# Patient Record
Sex: Male | Born: 1965 | Race: White | Hispanic: No | Marital: Single | Smoking: Never smoker
Health system: Southern US, Community
[De-identification: ages and names within clinical notes are randomized; demographics above are authoritative.]

## PROBLEM LIST (undated history)

## (undated) DIAGNOSIS — G629 Polyneuropathy, unspecified: Secondary | ICD-10-CM

## (undated) DIAGNOSIS — E119 Type 2 diabetes mellitus without complications: Secondary | ICD-10-CM

---

## 2020-12-05 ENCOUNTER — Other Ambulatory Visit: Payer: Self-pay

## 2020-12-05 ENCOUNTER — Emergency Department (HOSPITAL_COMMUNITY)
Admission: EM | Admit: 2020-12-05 | Discharge: 2020-12-05 | Disposition: A | Discharge status: Home / Self Care | Payer: Medicare (Managed Care) | Attending: Emergency Medicine | Admitting: Emergency Medicine

## 2020-12-05 ENCOUNTER — Emergency Department (HOSPITAL_COMMUNITY): Payer: Medicare (Managed Care)

## 2020-12-05 ENCOUNTER — Encounter (HOSPITAL_COMMUNITY): Payer: Self-pay

## 2020-12-05 DIAGNOSIS — S99921A Unspecified injury of right foot, initial encounter: Secondary | ICD-10-CM | POA: Diagnosis present

## 2020-12-05 DIAGNOSIS — S92424A Nondisplaced fracture of distal phalanx of right great toe, initial encounter for closed fracture: Secondary | ICD-10-CM | POA: Diagnosis not present

## 2020-12-05 DIAGNOSIS — S7001XA Contusion of right hip, initial encounter: Secondary | ICD-10-CM | POA: Insufficient documentation

## 2020-12-05 DIAGNOSIS — S161XXA Strain of muscle, fascia and tendon at neck level, initial encounter: Secondary | ICD-10-CM | POA: Insufficient documentation

## 2020-12-05 DIAGNOSIS — S7000XA Contusion of unspecified hip, initial encounter: Secondary | ICD-10-CM

## 2020-12-05 DIAGNOSIS — E1140 Type 2 diabetes mellitus with diabetic neuropathy, unspecified: Secondary | ICD-10-CM | POA: Diagnosis not present

## 2020-12-05 DIAGNOSIS — W009XXA Unspecified fall due to ice and snow, initial encounter: Secondary | ICD-10-CM | POA: Insufficient documentation

## 2020-12-05 DIAGNOSIS — S7011XA Contusion of right thigh, initial encounter: Secondary | ICD-10-CM | POA: Diagnosis not present

## 2020-12-05 DIAGNOSIS — S7010XA Contusion of unspecified thigh, initial encounter: Secondary | ICD-10-CM

## 2020-12-05 HISTORY — DX: Type 2 diabetes mellitus without complications: E11.9

## 2020-12-05 HISTORY — DX: Polyneuropathy, unspecified: G62.9

## 2020-12-05 MED ORDER — KETOROLAC TROMETHAMINE 60 MG/2ML IM SOLN
60.0000 mg | Freq: Once | INTRAMUSCULAR | Status: AC
  Administered 2020-12-05: 60 mg via INTRAMUSCULAR
  Filled 2020-12-05: qty 2

## 2020-12-05 MED ORDER — METHOCARBAMOL 500 MG PO TABS: 500.0000 mg | ORAL_TABLET | Freq: Three times a day (TID) | ORAL | 0 refills | Status: AC | PRN

## 2020-12-05 NOTE — ED Provider Notes (Signed)
[facility]-EMERGENCY DEPT Provider Note   CSN: [id number] Arrival date & time: 12/05/20  2034     History Chief Complaint  Patient presents with  . Fall    Dylan Vega is a 55 y.o. male.  Patient presents to the emergency department with multiple complaints after a fall that occurred yesterday.  Patient reports that he slipped on ice and fell on steps.  He slid down an entire flight of steps on his right hip.  He did not hit his head or lose consciousness.  Patient complaining of pain in his right great toe, right hip and across his upper back and neck.        Past Medical History:  Diagnosis Date  . Diabetes mellitus without complication (HCC)   . Neuropathy     There are no problems to display for this patient.        No family history on file.     Home Medications Prior to Admission medications   Medication Sig Start Date End Date Taking? Authorizing Provider  methocarbamol (ROBAXIN) 500 MG tablet Take 1 tablet (500 mg total) by mouth every 8 (eight) hours as needed for muscle spasms. 12/05/20  Yes Kordel Leavy, Canary Brim, MD    Allergies    Patient has no allergy information on record.  Review of Systems   Review of Systems  Musculoskeletal: Positive for arthralgias, back pain and neck pain.  Skin: Positive for color change.  Neurological: Positive for headaches.  All other systems reviewed and are negative.   Physical Exam Updated Vital Signs BP 122/79 (BP Location: Left Arm)   Pulse 95   Temp 98.7 F (37.1 C) (Oral)   Resp 16   Ht 6' (1.829 m)   Wt 108.9 kg   SpO2 96%   BMI 32.55 kg/m   Physical Exam Vitals and nursing note reviewed.  Constitutional:      General: He is not in acute distress.    Appearance: Normal appearance. He is well-developed and well-nourished.  HENT:     Head: Normocephalic and atraumatic.     Right Ear: Hearing normal.     Left Ear: Hearing normal.     Nose: Nose normal.     Mouth/Throat:      Mouth: Oropharynx is clear and moist and mucous membranes are normal.  Eyes:     Extraocular Movements: EOM normal.     Conjunctiva/sclera: Conjunctivae normal.     Pupils: Pupils are equal, round, and reactive to light.  Neck:   Cardiovascular:     Rate and Rhythm: Regular rhythm.     Heart sounds: S1 normal and S2 normal. No murmur heard. No friction rub. No gallop.   Pulmonary:     Effort: Pulmonary effort is normal. No respiratory distress.     Breath sounds: Normal breath sounds.  Chest:     Chest wall: No tenderness.  Abdominal:     General: Bowel sounds are normal.     Palpations: Abdomen is soft. There is no hepatosplenomegaly.     Tenderness: There is no abdominal tenderness. There is no guarding or rebound. Negative signs include Murphy's sign and McBurney's sign.     Hernia: No hernia is present.  Musculoskeletal:        General: Normal range of motion.     Cervical back: Normal range of motion and neck supple. No rigidity. Muscular tenderness present. No spinous process tenderness. Normal range of motion.     Thoracic back:  Tenderness present. No bony tenderness.       Back:     Left hip: No deformity or tenderness. Normal range of motion.     Left upper leg: Swelling and tenderness present.     Right foot: Tenderness (R great toe) present. No deformity or laceration.       Legs:  Skin:    General: Skin is warm, dry and intact.     Findings: No rash.     Nails: There is no cyanosis.  Neurological:     Mental Status: He is alert and oriented to person, place, and time.     GCS: GCS eye subscore is 4. GCS verbal subscore is 5. GCS motor subscore is 6.     Cranial Nerves: No cranial nerve deficit.     Sensory: No sensory deficit.     Coordination: Coordination normal.     Deep Tendon Reflexes: Strength normal.  Psychiatric:        Mood and Affect: Mood and affect normal.        Speech: Speech normal.        Behavior: Behavior normal.        Thought  Content: Thought content normal.     ED Results / Procedures / Treatments   Labs (all labs ordered are listed, but only abnormal results are displayed) Labs Reviewed - No data to display  EKG None  Radiology DG Foot Complete Right  Result Date: 12/05/2020 CLINICAL DATA:  Larey Seat down stairs yesterday, right great toe pain EXAM: RIGHT FOOT COMPLETE - 3+ VIEW COMPARISON:  None. FINDINGS: Frontal, oblique, lateral views of the right foot are obtained. There is a small fracture through the medial aspect of the distal tuft of the first distal phalanx, with overlying soft tissue swelling. No other acute bony abnormalities. Mild diffuse interphalangeal joint space narrowing consistent with osteoarthritis. Small inferior calcaneal spur. IMPRESSION: 1. Small fracture medial aspect distal tuft first distal phalanx. 2. Mild osteoarthritis. Electronically Signed   By: Sharlet Salina M.D.   On: 12/05/2020 21:33   DG Hip Unilat  With Pelvis 2-3 Views Right  Result Date: 12/05/2020 CLINICAL DATA:  Larey Seat down 15 stairs yesterday, right hip pain EXAM: DG HIP (WITH OR WITHOUT PELVIS) 2-3V RIGHT COMPARISON:  None. FINDINGS: Frontal view of the pelvis as well as frontal and frogleg lateral views of the right hip are obtained. No acute fracture, subluxation, or dislocation. Mild symmetrical hip osteoarthritis. Sacroiliac joints are normal. IMPRESSION: 1. No acute displaced fracture. 2. Mild symmetrical hip osteoarthritis. Electronically Signed   By: Sharlet Salina M.D.   On: 12/05/2020 21:34    Procedures Procedures   Medications Ordered in ED Medications  ketorolac (TORADOL) injection 60 mg (60 mg Intramuscular Given 12/05/20 2336)    ED Course  I have reviewed the triage vital signs and the nursing notes.  Pertinent labs & imaging results that were available during my care of the patient were reviewed by me and considered in my medical decision making (see chart for details).    MDM Rules/Calculators/A&P                           Patient presents to the emergency department for evaluation after a fall.  Patient with a large bruising hematoma of the lateral aspect of the right thigh but x-ray is normal.  Straight of toe shows nondisplaced tuft fracture, does not require any specific treatment.  He is concerned  about being a diabetic, will refer to podiatry for ongoing foot care.  Patient with right-sided paraspinal neck tenderness and upper back/shoulder tenderness but no significant midline tenderness, does not require imaging.  Cervical spine cleared by Nexus criteria.  Final Clinical Impression(s) / ED Diagnoses Final diagnoses:  Cervical strain, acute, initial encounter  Contusion of hip and thigh, initial encounter  Closed nondisplaced fracture of distal phalanx of right great toe, initial encounter    Rx / DC Orders ED Discharge Orders         Ordered    methocarbamol (ROBAXIN) 500 MG tablet  Every 8 hours PRN        12/05/20 2341           Gilda Crease, MD 12/05/20 2341

## 2020-12-05 NOTE — ED Triage Notes (Signed)
Patient arrived stating yesterday he slid down about 15 stairs on his right hip. Complaints of right great toe pain and some upper back pain. No LOC. Significant right hip bruising. Patient ambulatory in triage.

## 2020-12-08 ENCOUNTER — Other Ambulatory Visit: Payer: Self-pay

## 2020-12-08 ENCOUNTER — Encounter (HOSPITAL_COMMUNITY): Payer: Self-pay

## 2020-12-08 ENCOUNTER — Emergency Department (HOSPITAL_COMMUNITY)
Admission: EM | Admit: 2020-12-08 | Discharge: 2020-12-08 | Disposition: A | Discharge status: Home / Self Care | Payer: Medicare (Managed Care) | Attending: Emergency Medicine | Admitting: Emergency Medicine

## 2020-12-08 ENCOUNTER — Emergency Department (HOSPITAL_COMMUNITY): Payer: Medicare (Managed Care)

## 2020-12-08 DIAGNOSIS — S3992XA Unspecified injury of lower back, initial encounter: Secondary | ICD-10-CM | POA: Insufficient documentation

## 2020-12-08 DIAGNOSIS — W109XXA Fall (on) (from) unspecified stairs and steps, initial encounter: Secondary | ICD-10-CM | POA: Insufficient documentation

## 2020-12-08 DIAGNOSIS — E1140 Type 2 diabetes mellitus with diabetic neuropathy, unspecified: Secondary | ICD-10-CM | POA: Diagnosis not present

## 2020-12-08 DIAGNOSIS — M549 Dorsalgia, unspecified: Secondary | ICD-10-CM

## 2020-12-08 MED ORDER — IBUPROFEN 800 MG PO TABS: 800.0000 mg | ORAL_TABLET | Freq: Three times a day (TID) | ORAL | 0 refills | Status: AC

## 2020-12-08 MED ORDER — IBUPROFEN 800 MG PO TABS
800.0000 mg | ORAL_TABLET | Freq: Once | ORAL | Status: AC
  Administered 2020-12-08: 800 mg via ORAL
  Filled 2020-12-08: qty 1

## 2020-12-08 MED ORDER — LIDOCAINE 5 % EX PTCH: 1.0000 | MEDICATED_PATCH | CUTANEOUS | 0 refills | Status: AC

## 2020-12-08 MED ORDER — DICLOFENAC SODIUM 1 % EX GEL: 2.0000 g | Freq: Four times a day (QID) | CUTANEOUS | 0 refills | Status: AC

## 2020-12-08 NOTE — ED Provider Notes (Signed)
[facility]-EMERGENCY DEPT Provider Note   CSN: [id number] Arrival date & time: 12/08/20  1159     History Chief Complaint  Patient presents with  . Back Pain    Dylan Vega is a 55 y.o. male.  HPI 55 year old male with history of DM type II, who had fallen down 15 steps on 2/8, seen here in the emergency department presents to the ER with complaints of ongoing low back pain.  Patient states that he has chronic chronic opioids, but these have not been helpful.  He denies any numbness or tingling in his extremities.  No noticeable weakness.  States that he was told that if his pain does not improve, he should return to the ER for CT scan.  He does state that he is just having more pain all over.  He states that he called his insurance company and was told that he needs to come back to the ER and get a referral to neurosurgery for further evaluation    Past Medical History:  Diagnosis Date  . Diabetes mellitus without complication (HCC)   . Neuropathy     There are no problems to display for this patient.   History reviewed. No pertinent surgical history.     History reviewed. No pertinent family history.     Home Medications Prior to Admission medications   Medication Sig Start Date End Date Taking? Authorizing Provider  diclofenac Sodium (VOLTAREN) 1 % GEL Apply 2 g topically 4 (four) times daily for 7 days. 12/08/20 12/15/20 Yes Mare Ferrari, PA-C  ibuprofen (ADVIL) 800 MG tablet Take 1 tablet (800 mg total) by mouth 3 (three) times daily. 12/08/20  Yes Trudee Grip A, PA-C  lidocaine (LIDODERM) 5 % Place 1 patch onto the skin daily. Remove & Discard patch within 12 hours or as directed by MD 12/08/20  Yes Mare Ferrari, PA-C  methocarbamol (ROBAXIN) 500 MG tablet Take 1 tablet (500 mg total) by mouth every 8 (eight) hours as needed for muscle spasms. 12/05/20   Gilda Crease, MD    Allergies    Patient has no known allergies.  Review of  Systems   Review of Systems  Musculoskeletal: Positive for back pain and neck pain.  Neurological: Negative for weakness and numbness.    Physical Exam Updated Vital Signs BP (!) 143/84 (BP Location: Right Arm)   Pulse 91   Temp 98.1 F (36.7 C) (Oral)   Resp 17   SpO2 96%   Physical Exam Vitals and nursing note reviewed.  Constitutional:      Appearance: He is well-developed and well-nourished.  HENT:     Head: Normocephalic and atraumatic.  Eyes:     Conjunctiva/sclera: Conjunctivae normal.  Cardiovascular:     Rate and Rhythm: Normal rate and regular rhythm.     Heart sounds: No murmur heard.   Pulmonary:     Effort: Pulmonary effort is normal. No respiratory distress.     Breath sounds: Normal breath sounds.  Abdominal:     Palpations: Abdomen is soft.     Tenderness: There is no abdominal tenderness.  Musculoskeletal:        General: Tenderness and signs of injury present. No swelling or edema. Normal range of motion.     Cervical back: Normal range of motion and neck supple. Tenderness present.     Right lower leg: No edema.     Left lower leg: No edema.     Comments: Midline tenderness  to the C, T, L-spine.  Moving all 4 extremities largely without difficulty.  He has full range of motion of his joints, evidence of a large hematoma to the right lateral thigh as documented in prior chart.  5/5 strength in upper and lower extremities bilaterally.  He also has associated paraspinal muscle tenderness in the cervical, thoracic and lumbar paraspinal muscles  Skin:    General: Skin is warm and dry.  Neurological:     General: No focal deficit present.     Mental Status: He is alert and oriented to person, place, and time.  Psychiatric:        Mood and Affect: Mood and affect and mood normal.        Behavior: Behavior normal.     ED Results / Procedures / Treatments   Labs (all labs ordered are listed, but only abnormal results are displayed) Labs Reviewed - No  data to display  EKG None  Radiology DG Cervical Spine Complete  Result Date: 12/08/2020 CLINICAL DATA:  Fall several days ago with persistent neck pain, initial encounter EXAM: CERVICAL SPINE - COMPLETE 4+ VIEW COMPARISON:  None. FINDINGS: Seven cervical segments are well visualized. Vertebral body height is maintained. The odontoid is within normal limits. No acute fracture or acute facet abnormality is noted. No soft tissue abnormality is seen. Mild osteophytic changes are noted at C5-6. IMPRESSION: Mild degenerative change without acute abnormality. Electronically Signed   By: Alcide Clever M.D.   On: 12/08/2020 14:56   DG Thoracic Spine 2 View  Result Date: 12/08/2020 CLINICAL DATA:  Recent fall downstairs with back pain, initial encounter EXAM: THORACIC SPINE 2 VIEWS COMPARISON:  None. FINDINGS: Vertebral body height is well maintained. Mild osteophytic changes are seen. No paraspinal mass or pedicle abnormality is noted. No rib abnormality is seen. IMPRESSION: No acute abnormality noted. Electronically Signed   By: Alcide Clever M.D.   On: 12/08/2020 14:57   DG Lumbar Spine Complete  Result Date: 12/08/2020 CLINICAL DATA:  Fall downstairs with low back pain, initial encounter EXAM: LUMBAR SPINE - COMPLETE 4+ VIEW COMPARISON:  None. FINDINGS: Five lumbar type vertebral bodies are well visualized. Vertebral body height is well maintained. No pars defects are noted. Multilevel disc space narrowing is noted with mild osteophytic changes worst at L5-S1. No anterolisthesis is seen. No soft tissue abnormality is noted. IMPRESSION: Multilevel degenerative change without acute abnormality. Electronically Signed   By: Alcide Clever M.D.   On: 12/08/2020 14:58    Procedures Procedures   Medications Ordered in ED Medications  ibuprofen (ADVIL) tablet 800 mg (800 mg Oral Given 12/08/20 1332)    ED Course  I have reviewed the triage vital signs and the nursing notes.  Pertinent labs & imaging  results that were available during my care of the patient were reviewed by me and considered in my medical decision making (see chart for details).    MDM Rules/Calculators/A&P                          55 year old male who presents with back pain after a fall which occurred on 2/8. On arrival, he is well-appearing, no acute distress, resting comfortably in the ER bed.  He is moving all 4 extremities, no noticeable weakness on exam, he does have midline tenderness in the C, T, L-spine, as well as associated paraspinal muscle tenderness all throughout the back.  He is neurovascularly intact.  He has evidence of  an old hematoma to the right thigh as documented in prior chart.  We discussed imaging modalities, I did order x-rays of the C, T, L-spine which show mild degenerative changes but no evidence of trauma.  I had a discussion with the patient about possible CT scan of the cervical spine, however I did explain that the x-rays were reassuring.  I did explain to him that this could miss a fracture, and would be happy to order it, but he could also follow-up with neurosurgery for better evaluation.  He would prefer the latter, stating he is okay with foregoing CT scan at this time.  He is already on chronic opioids, states he does not want any prescription  For anything stronger than Ibuprofen.  He is requesting 800 mg of ibuprofen which I was happy to provide.  He states he has muscle relaxers at home.  I provided neurosurgery referral for for him.  We discussed return precautions.  He was understanding is agreeable.  At this stage in the the ED course, the patient is medically screened stable for discharge.  Case discussed with Dr. Criss Alvine who is agreeable to the plan and disposition.   Final Clinical Impression(s) / ED Diagnoses Final diagnoses:  Back pain due to injury    Rx / DC Orders ED Discharge Orders         Ordered    lidocaine (LIDODERM) 5 %  Every 24 hours        12/08/20 1524     diclofenac Sodium (VOLTAREN) 1 % GEL  4 times daily        12/08/20 1524    ibuprofen (ADVIL) 800 MG tablet  3 times daily        12/08/20 1538           Leone Brand 12/08/20 1546    Pricilla Loveless, MD 12/08/20 1605

## 2020-12-08 NOTE — ED Triage Notes (Addendum)
Pt arrived via walk in, states he was seen 2/8 after falling down some concrete steps, told to return to ED for CT if back pain unresolved.

## 2020-12-08 NOTE — Discharge Instructions (Addendum)
Your x-rays today were overall reassuring.  Please follow-up with the spine doctor, whose contact information I provided in your discharge paperwork.  You may apply the Lidoderm patches and the Voltaren gel as needed.  Continue to take your pain medicine as directed.  Return to the ER for any new or worsening symptoms.

## 2020-12-12 ENCOUNTER — Other Ambulatory Visit: Payer: Self-pay

## 2020-12-12 ENCOUNTER — Ambulatory Visit (INDEPENDENT_AMBULATORY_CARE_PROVIDER_SITE_OTHER): Payer: Medicare (Managed Care) | Admitting: Podiatry

## 2020-12-12 DIAGNOSIS — S92424A Nondisplaced fracture of distal phalanx of right great toe, initial encounter for closed fracture: Secondary | ICD-10-CM | POA: Diagnosis not present

## 2020-12-12 DIAGNOSIS — S9031XA Contusion of right foot, initial encounter: Secondary | ICD-10-CM | POA: Diagnosis not present

## 2020-12-12 DIAGNOSIS — W108XXA Fall (on) (from) other stairs and steps, initial encounter: Secondary | ICD-10-CM

## 2020-12-12 MED ORDER — MUPIROCIN 2 % EX OINT: 1.0000 "application " | TOPICAL_OINTMENT | Freq: Two times a day (BID) | CUTANEOUS | 2 refills | Status: AC

## 2020-12-12 NOTE — Patient Instructions (Signed)
Apply the mupirocin ointment twice daily   Wear the CAM boot when walking for at least 4 weeks. Then you may discontinue and begin walking in a supportive shoe around the house

## 2020-12-14 ENCOUNTER — Encounter: Payer: Self-pay | Admitting: Podiatry

## 2020-12-14 NOTE — Progress Notes (Signed)
  Subjective:  Patient ID: Dylan Vega, male    DOB: Oct 27, 1966,  MRN: [id number]  Chief Complaint  Patient presents with  . Callouses    Right hallux callus. Er referral. PT stated that he was told his big toe is broken    55 y.o. male presents with the above complaint. History confirmed with patient.  He said he fell on the stairs on February 8 at his apartment, they were very icy and he did not see them.  He slipped down several stairs.  Was evaluated at urgent care.  X-ray showed a right hallux fracture.  Overall pain is improving.  Objective:  Physical Exam: warm, good capillary refill, no trophic changes or ulcerative lesions, normal DP and PT pulses and normal sensory exam.  Right Foot: Mild edema over hallux and pain to palpation to the distal phalanx  No images are attached to the encounter.  Study Result  Narrative & Impression  CLINICAL DATA:  Larey Seat down stairs yesterday, right great toe pain  EXAM: RIGHT FOOT COMPLETE - 3+ VIEW  COMPARISON:  None.  FINDINGS: Frontal, oblique, lateral views of the right foot are obtained. There is a small fracture through the medial aspect of the distal tuft of the first distal phalanx, with overlying soft tissue swelling. No other acute bony abnormalities. Mild diffuse interphalangeal joint space narrowing consistent with osteoarthritis. Small inferior calcaneal spur.  IMPRESSION: 1. Small fracture medial aspect distal tuft first distal phalanx. 2. Mild osteoarthritis.   Electronically Signed   By: Sharlet Salina M.D.   On: 12/05/2020 21:33    Assessment:   1. Closed nondisplaced fracture of distal phalanx of right great toe, initial encounter   2. Contusion of right foot, initial encounter   3. Fall down stairs, initial encounter      Plan:  Patient was evaluated and treated and all questions answered.   Reviewed the x-ray with the patient.  Discussed with him that this thankfully is a injury that does not  require surgical intervention.  Expect this to heal in the next 6 to 8 weeks total.  I think immobilization in a CAM boot would be beneficial for him.  A short top CAM boot was dispensed and I would like him to wear this for least 4 weeks.  He is returning to [place] in a few weeks and he is to return to see me when he is back if he is still having issues.  Reviewed RICE protocol and encouraged ice when it is painful and swollen  No follow-ups on file.

## 2020-12-15 ENCOUNTER — Emergency Department (HOSPITAL_COMMUNITY): Payer: Medicare (Managed Care)

## 2020-12-15 ENCOUNTER — Other Ambulatory Visit: Payer: Self-pay

## 2020-12-15 ENCOUNTER — Emergency Department (HOSPITAL_COMMUNITY)
Admission: EM | Admit: 2020-12-15 | Discharge: 2020-12-15 | Disposition: A | Discharge status: Home / Self Care | Payer: Medicare (Managed Care) | Attending: Emergency Medicine | Admitting: Emergency Medicine

## 2020-12-15 ENCOUNTER — Encounter (HOSPITAL_COMMUNITY): Payer: Self-pay | Admitting: *Deleted

## 2020-12-15 DIAGNOSIS — M549 Dorsalgia, unspecified: Secondary | ICD-10-CM | POA: Insufficient documentation

## 2020-12-15 DIAGNOSIS — M791 Myalgia, unspecified site: Secondary | ICD-10-CM

## 2020-12-15 DIAGNOSIS — M542 Cervicalgia: Secondary | ICD-10-CM | POA: Insufficient documentation

## 2020-12-15 DIAGNOSIS — L84 Corns and callosities: Secondary | ICD-10-CM | POA: Diagnosis not present

## 2020-12-15 DIAGNOSIS — E119 Type 2 diabetes mellitus without complications: Secondary | ICD-10-CM | POA: Insufficient documentation

## 2020-12-15 DIAGNOSIS — M79672 Pain in left foot: Secondary | ICD-10-CM

## 2020-12-15 NOTE — ED Triage Notes (Signed)
Pt states he fell on the 6th of Feb. He was seen for neck, rt foot pain, rt hip and back. Now pain continues and he has pain in his left toes.

## 2020-12-15 NOTE — Discharge Instructions (Addendum)
Your foot x-ray is normal.  Follow up with sports medicine, call to schedule as appointment.

## 2020-12-15 NOTE — ED Provider Notes (Signed)
[facility]-EMERGENCY DEPT Provider Note   CSN: [id number] Arrival date & time: 12/15/20  1145     History Chief Complaint  Patient presents with  . Back Pain  . Neck Pain  . Left toe pain    Kamareon Sciandra is a 55 y.o. male.  55 year old male returns to the ER, now with complaint of left foot/toe pain. Patient reports ongoing pain in his back, had XRs of his neck and back at prior visits, was referred to neurosurgery per request however due to his out of states insurance has not been able to schedule an appointment. Patient had a tuft fracture in his right great toe, was able to see podiatry and is in a CAM walker. Also requesting refill of his muscle relaxor. No new falls or injuries. Plans to return to [place] so that he can follow up but is requesting referral for local care for his injuries.         Past Medical History:  Diagnosis Date  . Diabetes mellitus without complication (HCC)   . Neuropathy     There are no problems to display for this patient.   History reviewed. No pertinent surgical history.     No family history on file.  Social History   Tobacco Use  . Smoking status: Never Smoker  . Smokeless tobacco: Never Used  Substance Use Topics  . Alcohol use: Yes  . Drug use: Never    Home Medications Prior to Admission medications   Medication Sig Start Date End Date Taking? Authorizing Provider  diclofenac Sodium (VOLTAREN) 1 % GEL Apply 2 g topically 4 (four) times daily for 7 days. 12/08/20 12/15/20  Mare Ferrari, PA-C  ibuprofen (ADVIL) 800 MG tablet Take 1 tablet (800 mg total) by mouth 3 (three) times daily. 12/08/20   Mare Ferrari, PA-C  lidocaine (LIDODERM) 5 % Place 1 patch onto the skin daily. Remove & Discard patch within 12 hours or as directed by MD 12/08/20   Mare Ferrari, PA-C  methocarbamol (ROBAXIN) 500 MG tablet Take 1 tablet (500 mg total) by mouth every 8 (eight) hours as needed for muscle spasms. 12/05/20    Gilda Crease, MD  mupirocin ointment (BACTROBAN) 2 % Apply 1 application topically 2 (two) times daily. 12/12/20   Edwin Cap, DPM    Allergies    Patient has no known allergies.  Review of Systems   Review of Systems  Constitutional: Negative for fever.  Musculoskeletal: Positive for arthralgias.  Skin: Negative for wound.  Allergic/Immunologic: Positive for immunocompromised state.  Neurological: Negative for weakness and numbness.    Physical Exam Updated Vital Signs BP (!) 157/96 (BP Location: Right Arm)   Pulse (!) 107   Temp 98.1 F (36.7 C) (Oral)   Resp 17   Ht 6' (1.829 m)   Wt 106.6 kg   SpO2 99%   BMI 31.87 kg/m   Physical Exam Vitals and nursing note reviewed.  Constitutional:      General: He is not in acute distress.    Appearance: He is well-developed and well-nourished. He is not diaphoretic.  HENT:     Head: Normocephalic and atraumatic.  Cardiovascular:     Pulses: Normal pulses.  Pulmonary:     Effort: Pulmonary effort is normal.  Musculoskeletal:        General: Tenderness present. No swelling or deformity.     Right lower leg: No edema.     Left lower  leg: No edema.  Feet:     Left foot:     Skin integrity: Callus present. No ulcer, blister, skin breakdown or erythema.     Comments: Left great to with callus, otherwise no erythema, skin intact, no swelling or specific tenderness.  Skin:    General: Skin is warm and dry.     Findings: No erythema or rash.  Neurological:     Mental Status: He is alert and oriented to person, place, and time.     Sensory: No sensory deficit.     Motor: No weakness.  Psychiatric:        Mood and Affect: Mood and affect normal.        Behavior: Behavior normal.     ED Results / Procedures / Treatments   Labs (all labs ordered are listed, but only abnormal results are displayed) Labs Reviewed - No data to display  EKG None  Radiology DG Foot Complete Left  Result Date:  12/15/2020 CLINICAL DATA:  Pain following fall EXAM: LEFT FOOT - COMPLETE 3+ VIEW COMPARISON:  None. FINDINGS: Frontal, oblique, and lateral views were obtained. There is no fracture or dislocation. Joint spaces appear normal. No erosive change. There is bony overgrowth along the medial proximal first distal phalanx, an apparent anatomic variant. There is a minimal inferior calcaneal spur. IMPRESSION: No fracture or dislocation.  No appreciable arthropathy. Electronically Signed   By: Bretta Bang III M.D.   On: 12/15/2020 12:57    Procedures Procedures   Medications Ordered in ED Medications - No data to display  ED Course  I have reviewed the triage vital signs and the nursing notes.  Pertinent labs & imaging results that were available during my care of the patient were reviewed by me and considered in my medical decision making (see chart for details).  Clinical Course as of 12/15/20 1321  Fri Dec 15, 2020  [id number] 54 year old male with complaint of left foot pain.  States he fell down stairs at his apartment on February 8, was seen the emergency room at that time as well as additional visit for neck and back pain and right foot pain.  Patient was found to have a tuft fracture of his right great toe and did follow-up with podiatry, is a cam boot.  Patient states that he is also had pain in his left foot and would like to get that checked out as well as referral to someone who can evaluate his neck and back ongoing pain.  On exam, has a large callus on his left great toe and otherwise no swelling, skin is intact.  X-ray left foot is unremarkable. Patient is referred to sports medicine for follow-up.  [LM]    Clinical Course User Index [LM] Alden Hipp   MDM Rules/Calculators/A&P                          Final Clinical Impression(s) / ED Diagnoses Final diagnoses:  Left foot pain  Myalgia    Rx / DC Orders ED Discharge Orders    None       Alden Hipp 12/15/20 1322    Lorre Nick, MD 12/19/20 1016

## 2020-12-20 ENCOUNTER — Ambulatory Visit: Payer: Medicare (Managed Care) | Admitting: Family Medicine

## 2020-12-21 ENCOUNTER — Ambulatory Visit: Payer: Medicare (Managed Care) | Admitting: Family Medicine

## 2020-12-21 ENCOUNTER — Telehealth: Payer: Self-pay | Admitting: Family Medicine

## 2020-12-21 NOTE — Telephone Encounter (Signed)
Jeni Salles from [facility] [phone], says patientt has reached out to them as his dual medicare/medicaid plan states that he is visiting a friend in [place] & fell down 15 concrete steps (has a brkn toe, brused hip /back,neck) and was ref'd by ED to Sports Medicine for f/u care.  ---Per Pt his PCP ( Dr.Anthony Politi in [phone]) will not approve referral appt w/ Sports Medicine unless pt comes to their office in [place] first. ---[facility] has advised pt to complete pre-auth & referral forms & submit them to his PCP/Dr.Politi @ fax# [phone] along w/ medical records frm ED visit for review & decision.  --Pt says doesn't have access to printer or fax machine (advised him I will download forms & mail him to his [place] address for his completion.  He needs to contact PCP to advised them he is sending forms to someone in their office for review/ also advised him of the options of Care Everywhere system that allows med recs review elsewhere if connected or that we can fax those ED notes to PCP for review . ---- Advised [facility] rep & patient that if he comes w/o perm mission/approval of PCP his svcs maybe denied payment & he will be responsible for charges.  FYI  glh

## 2020-12-26 ENCOUNTER — Other Ambulatory Visit: Payer: Self-pay

## 2020-12-26 ENCOUNTER — Ambulatory Visit (INDEPENDENT_AMBULATORY_CARE_PROVIDER_SITE_OTHER): Payer: Medicare (Managed Care) | Admitting: Family Medicine

## 2020-12-26 VITALS — BP 110/70 | Ht 72.0 in | Wt 235.0 lb

## 2020-12-26 DIAGNOSIS — S161XXA Strain of muscle, fascia and tendon at neck level, initial encounter: Secondary | ICD-10-CM | POA: Insufficient documentation

## 2020-12-26 DIAGNOSIS — S7001XA Contusion of right hip, initial encounter: Secondary | ICD-10-CM | POA: Diagnosis not present

## 2020-12-26 DIAGNOSIS — M545 Low back pain, unspecified: Secondary | ICD-10-CM | POA: Diagnosis not present

## 2020-12-26 MED ORDER — PREDNISONE 5 MG PO TABS: ORAL_TABLET | ORAL | 0 refills | Status: AC

## 2020-12-26 NOTE — Patient Instructions (Signed)
Nice to meet you Please continue the heat  Please try the exercises  Please try the prednisone. Please check your blood sugar.  Please try physical therapy   Please send me a message in MyChart with any questions or updates.  Please see me back in 2-3 weeks.   --Dr. Jordan Likes

## 2020-12-26 NOTE — Assessment & Plan Note (Signed)
This is pain seems more spasm related. -Counseled on home exercise therapy and supportive care. -Referral to physical therapy

## 2020-12-26 NOTE — Progress Notes (Signed)
  Dylan Vega - 55 y.o. male MRN [id number]  Date of birth: 1965/11/04  SUBJECTIVE:  Including CC & ROS.  No chief complaint on file.   Dylan Vega is a 55 y.o. male that is presenting with neck pain, back pain and hip pain.  He had a fall downstairs his apartment complex.  He does feel better but his symptoms are ongoing.  No history of similar pain..  Independent review of the right foot x-ray from 2/8 shows a distal tuft first phalanx fracture.  Independent review of the cervical spine x-ray from 2/11 shows mild degenerative changes.  Independent review of the AP pelvis and right hip x-ray shows no significant degenerative changes.  Independent review of the lumbar spine shows degenerative changes of the disc at L5-S1 and mild degenerative changes of the lumbar facet joints.   Review of Systems See HPI   HISTORY: Past Medical, Surgical, Social, and Family History Reviewed & Updated per EMR.   Pertinent Historical Findings include:  Past Medical History:  Diagnosis Date  . Diabetes mellitus without complication (HCC)   . Neuropathy     No past surgical history on file.  No family history on file.  Social History   Socioeconomic History  . Marital status: Single    Spouse name: Not on file  . Number of children: Not on file  . Years of education: Not on file  . Highest education level: Not on file  Occupational History  . Not on file  Tobacco Use  . Smoking status: Never Smoker  . Smokeless tobacco: Never Used  Substance and Sexual Activity  . Alcohol use: Yes  . Drug use: Never  . Sexual activity: Not on file  Other Topics Concern  . Not on file  Social History Narrative  . Not on file   Social Determinants of Health   Financial Resource Strain: Not on file  Food Insecurity: Not on file  Transportation Needs: Not on file  Physical Activity: Not on file  Stress: Not on file  Social Connections: Not on file  Intimate Partner Violence: Not on file      PHYSICAL EXAM:  VS: BP 110/70 (BP Location: Left Arm, Patient Position: Sitting, Cuff Size: Normal)   Ht 6' (1.829 m)   Wt 235 lb (106.6 kg)   BMI 31.87 kg/m  Physical Exam Gen: NAD, alert, cooperative with exam, well-appearing MSK:  Neck: Limited flexion. Normal extension. Normal lateral rotation to the left. Limited lateral rotation to the right Back: Limited flexion and extension. Normal strength resistance. Right hip: Tenderness palpation over the IT band. Ecchymosis over the distal portion of the IT band. Normal knee range of motion. Normal hip range of motion. Neurovascular intact     ASSESSMENT & PLAN:   Cervical strain He had a fall down a flight of stairs.  Injury occurred around 2/8.  It occurred at his apartment complex.  Has degenerative changes observed on x-ray.  Likely having spasm ongoing. -Counseled home exercise therapy and supportive care. -Referral to physical therapy. -Prednisone.  Acute bilateral low back pain without sciatica This is pain seems more spasm related. -Counseled on home exercise therapy and supportive care. -Referral to physical therapy  Contusion of right hip Had significant bruising over the lateral aspect of his hip.  No Norma Fredrickson lesion appreciated on exam. -Counseled on home exercise therapy and supportive care. -Further physical therapy.

## 2020-12-26 NOTE — Assessment & Plan Note (Signed)
He had a fall down a flight of stairs.  Injury occurred around 2/8.  It occurred at his apartment complex.  Has degenerative changes observed on x-ray.  Likely having spasm ongoing. -Counseled home exercise therapy and supportive care. -Referral to physical therapy. -Prednisone.

## 2020-12-26 NOTE — Assessment & Plan Note (Signed)
Had significant bruising over the lateral aspect of his hip.  No Norma Fredrickson lesion appreciated on exam. -Counseled on home exercise therapy and supportive care. -Further physical therapy.

## 2021-02-08 ENCOUNTER — Ambulatory Visit: Payer: Medicare (Managed Care) | Admitting: Family Medicine

## 2021-02-13 ENCOUNTER — Ambulatory Visit: Payer: Medicare (Managed Care) | Admitting: Podiatry

## 2022-06-23 IMAGING — CR DG CERVICAL SPINE COMPLETE 4+V
8 series · 8 of 8 positions shown · non-contrast
Comparison: None.

CLINICAL DATA: Fall several days ago with persistent neck pain,
initial encounter

EXAM:
CERVICAL SPINE - COMPLETE 4+ VIEW

[w cervical spine lat]
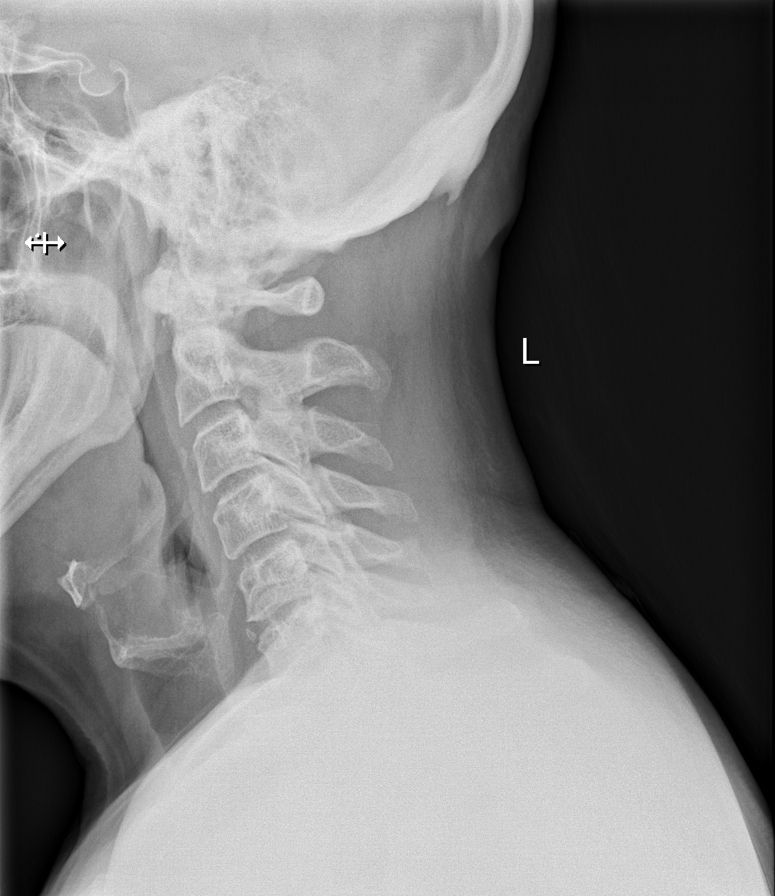

[w cervical spine ap_obl (1 of 2)]
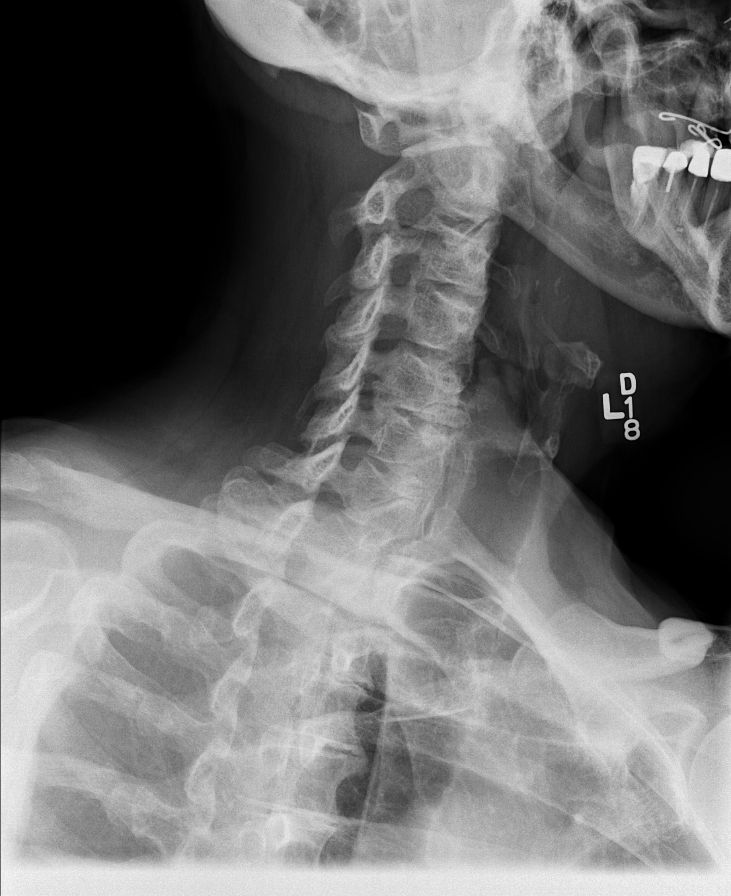

[w cervical spine ap_obl (2 of 2)]
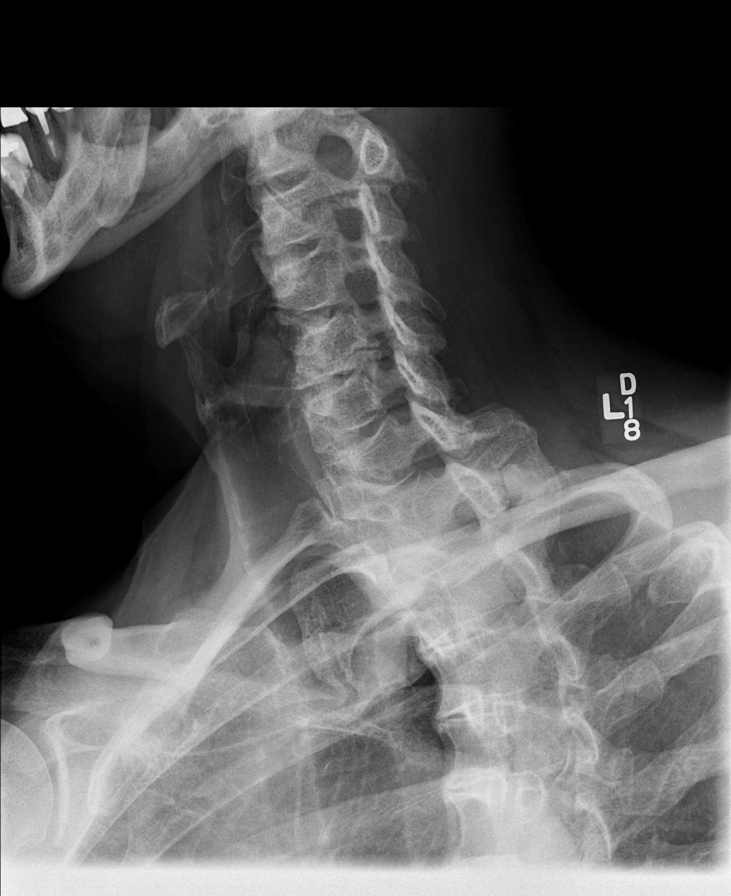

[w cervical spine ap]
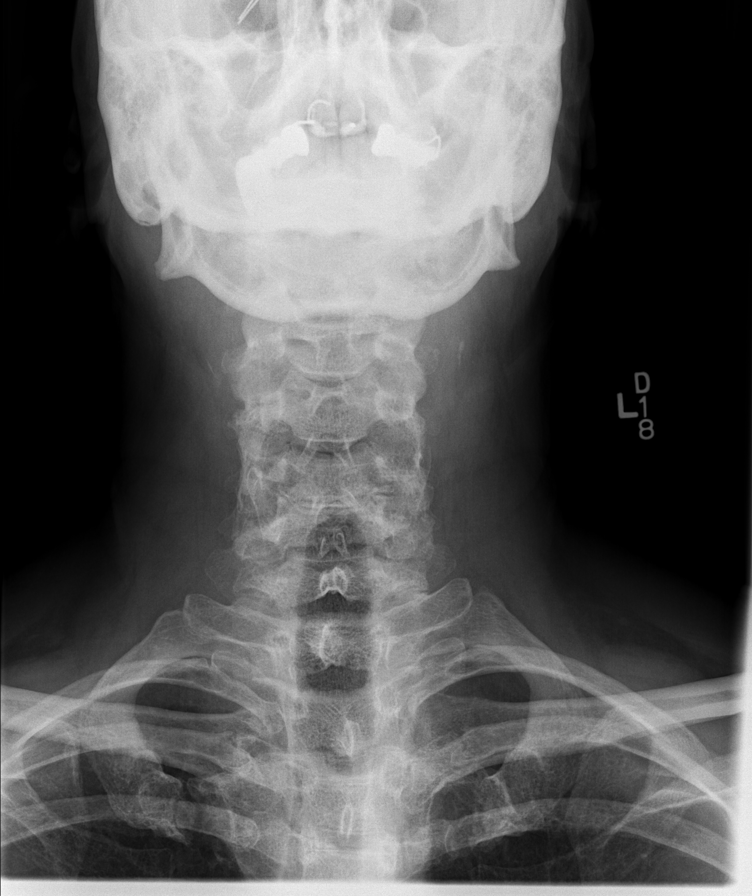

[w cervical spine odontoid (1 of 3)]
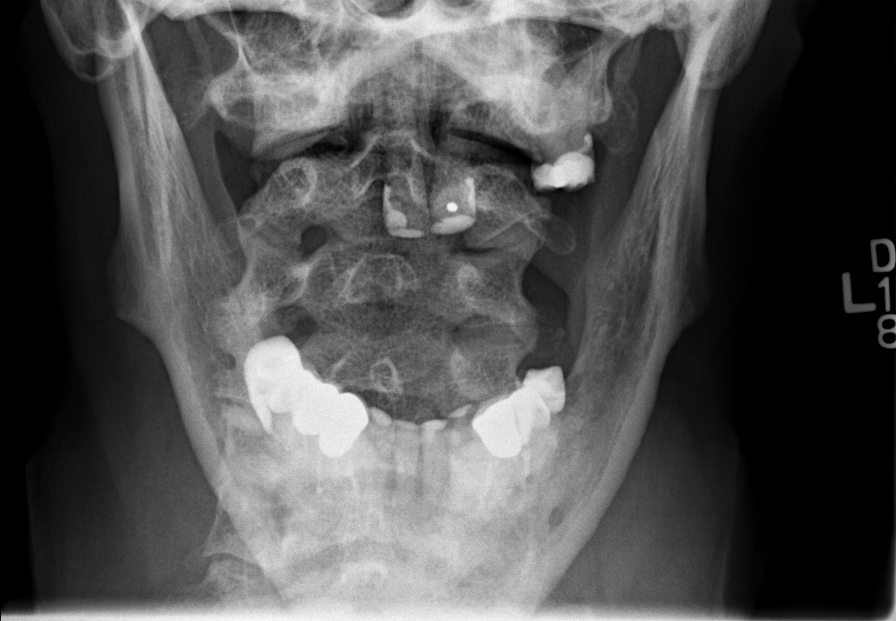

[w cervical spine odontoid (2 of 3)]
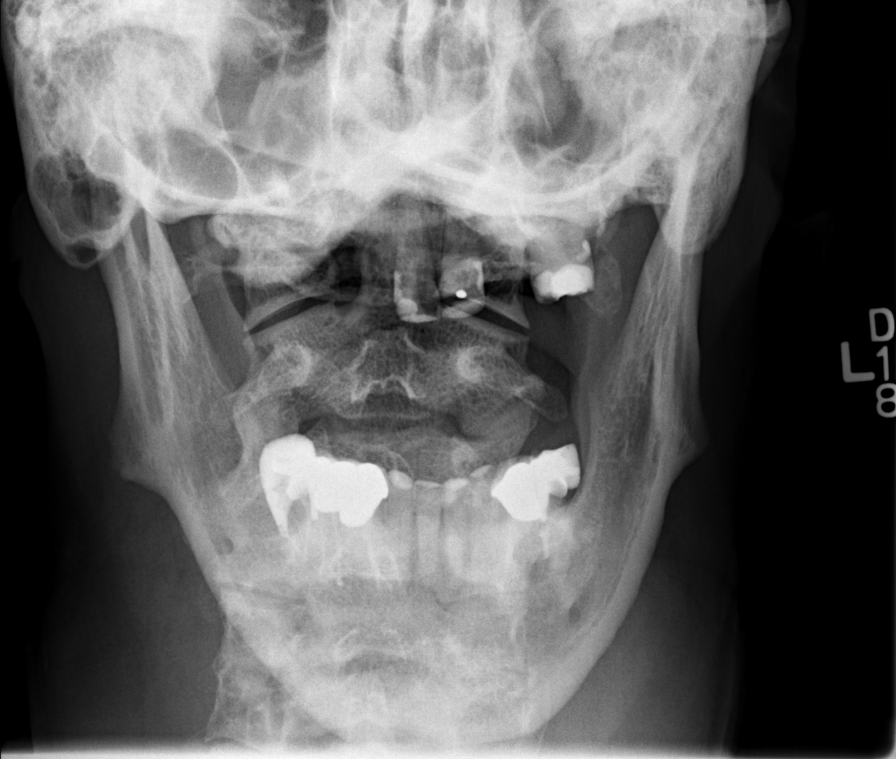

[w cervical spine odontoid (3 of 3)]
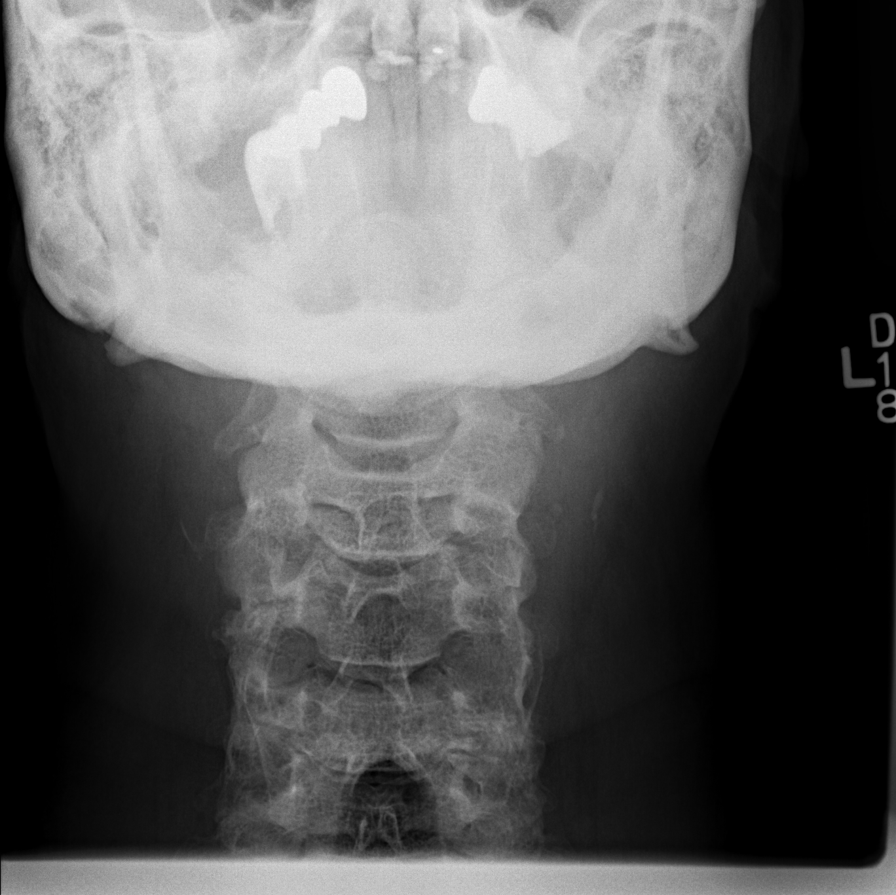

[w cervical swimmers]
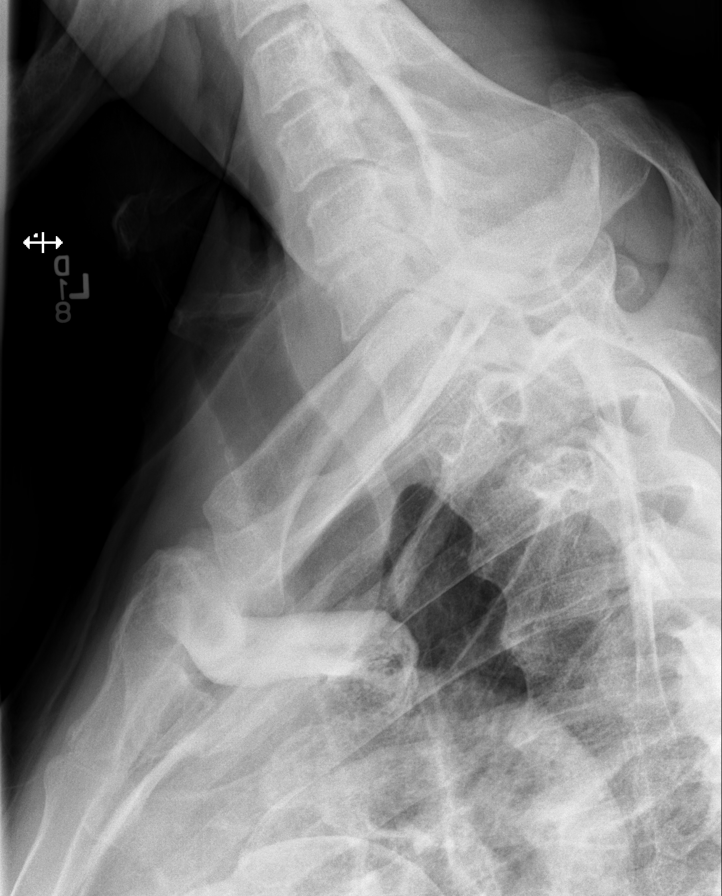

[8 of 8 positions shown; findings below may reference images not displayed]

FINDINGS: Seven cervical segments are well visualized. Vertebral body height
is maintained. The odontoid is within normal limits. No acute
fracture or acute facet abnormality is noted. No soft tissue
abnormality is seen. Mild osteophytic changes are noted at C5-6.
IMPRESSION: Mild degenerative change without acute abnormality.

## 2022-06-23 IMAGING — CR DG LUMBAR SPINE COMPLETE 4+V
5 series · 5 of 5 positions shown · non-contrast
Comparison: None.

CLINICAL DATA: Fall downstairs with low back pain, initial
encounter

EXAM:
LUMBAR SPINE - COMPLETE 4+ VIEW

[t lumbar spine ap]
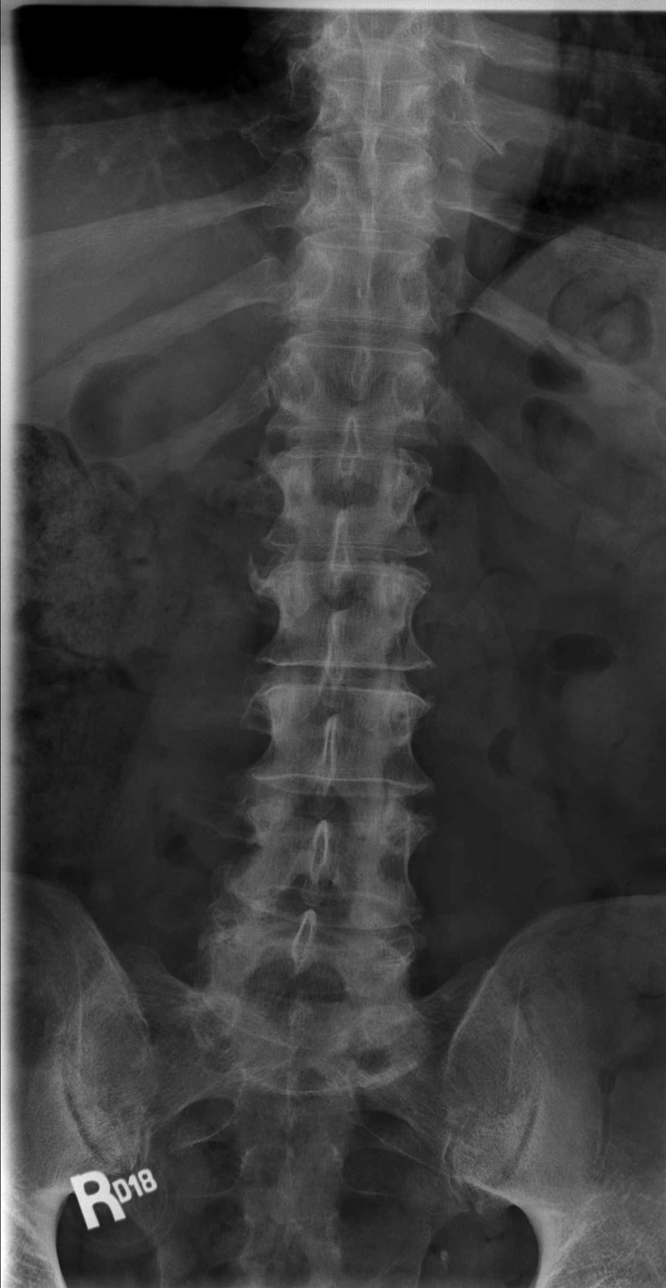

[t lumbar spine obl (1 of 2)]
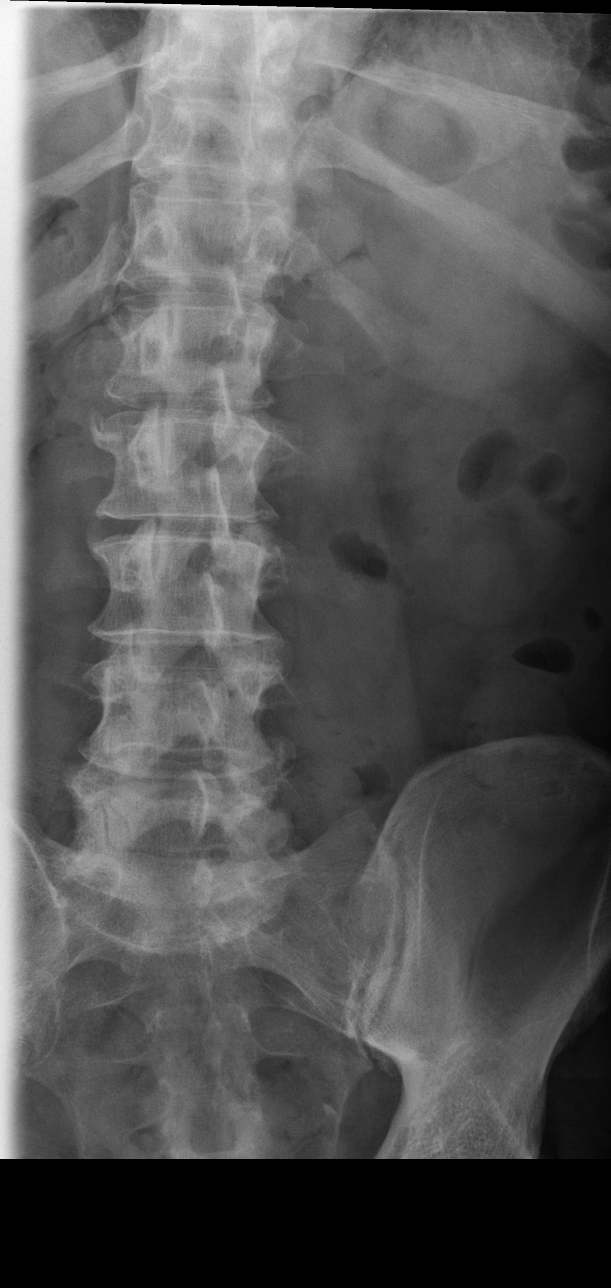

[t lumbar spine obl (2 of 2)]
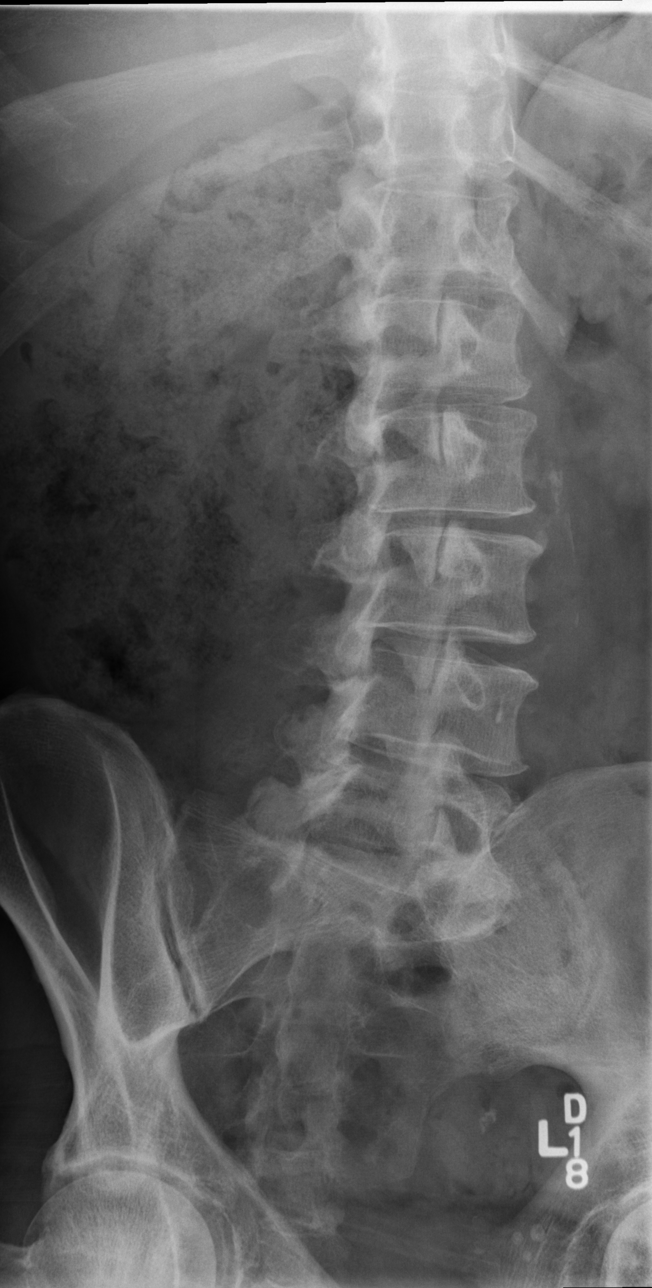

[t lumbar spine lat]
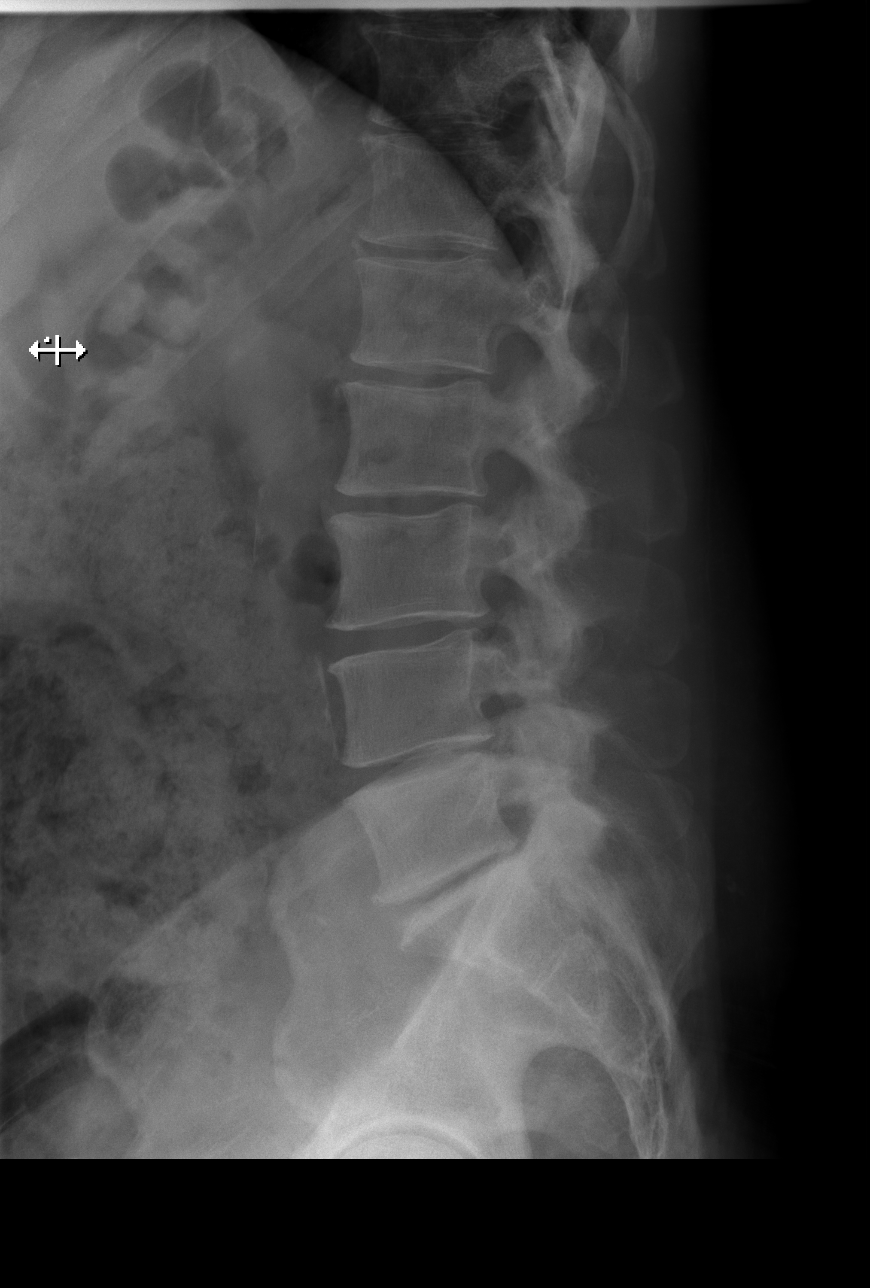

[t lumbar l-5 s-1 spot]
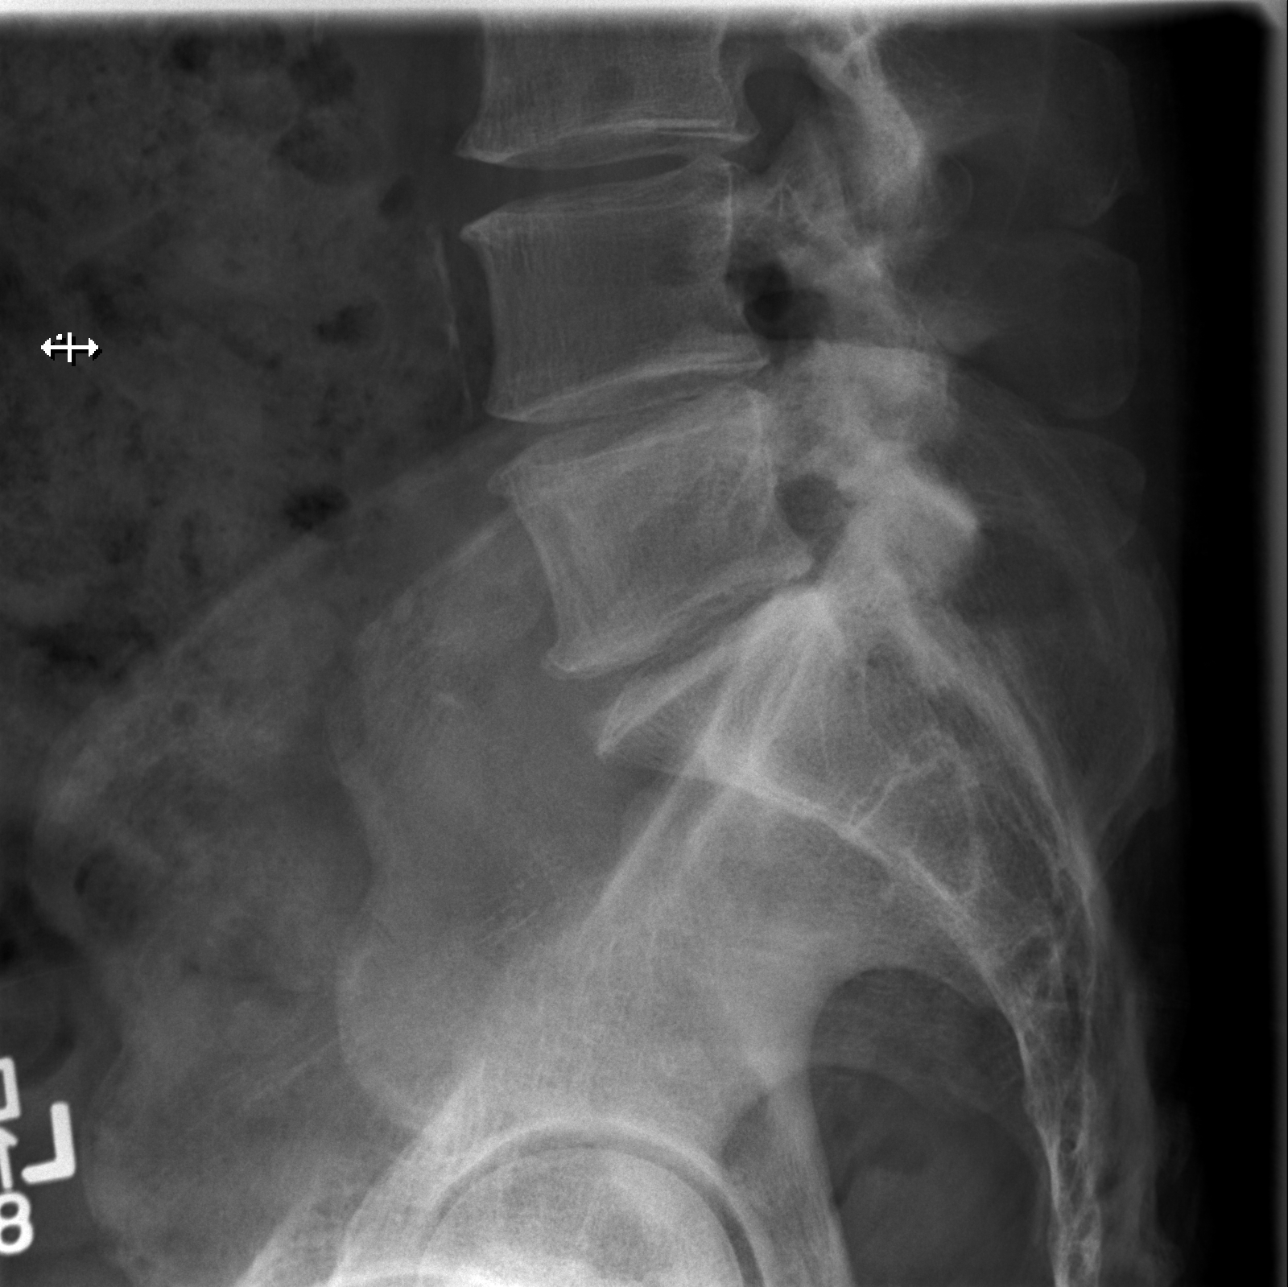

[5 of 5 positions shown; findings below may reference images not displayed]

FINDINGS: Five lumbar type vertebral bodies are well visualized. Vertebral
body height is well maintained. No pars defects are noted.
Multilevel disc space narrowing is noted with mild osteophytic
changes worst at L5-S1. No anterolisthesis is seen. No soft tissue
abnormality is noted.
IMPRESSION: Multilevel degenerative change without acute abnormality.

## 2023-02-10 ENCOUNTER — Encounter: Payer: Self-pay | Admitting: *Deleted
